# Patient Record
Sex: Female | Born: 1955 | Race: Asian | Hispanic: No | Smoking: Never smoker
Health system: Southern US, Community
[De-identification: ages and names within clinical notes are randomized; demographics above are authoritative.]

## PROBLEM LIST (undated history)

## (undated) ENCOUNTER — Emergency Department (HOSPITAL_COMMUNITY): Admission: EM | Payer: Managed Care, Other (non HMO) | Source: Home / Self Care

## (undated) DIAGNOSIS — N840 Polyp of corpus uteri: Secondary | ICD-10-CM

## (undated) DIAGNOSIS — N63 Unspecified lump in unspecified breast: Secondary | ICD-10-CM

## (undated) HISTORY — PX: BREAST ENHANCEMENT SURGERY: SHX7

## (undated) HISTORY — DX: Unspecified lump in unspecified breast: N63.0

## (undated) HISTORY — DX: Polyp of corpus uteri: N84.0

---

## 1986-02-02 HISTORY — PX: AUGMENTATION MAMMAPLASTY: SUR837

## 2003-10-12 ENCOUNTER — Other Ambulatory Visit: Admission: RE | Admit: 2003-10-12 | Discharge: 2003-10-12 | Payer: Self-pay | Admitting: Obstetrics and Gynecology

## 2003-11-05 ENCOUNTER — Encounter (INDEPENDENT_AMBULATORY_CARE_PROVIDER_SITE_OTHER): Payer: Self-pay | Admitting: Specialist

## 2003-11-05 ENCOUNTER — Ambulatory Visit (HOSPITAL_COMMUNITY): Admission: RE | Admit: 2003-11-05 | Discharge: 2003-11-05 | Payer: Self-pay | Admitting: Obstetrics and Gynecology

## 2003-11-30 ENCOUNTER — Encounter: Admission: RE | Admit: 2003-11-30 | Discharge: 2003-11-30 | Payer: Self-pay | Admitting: Obstetrics and Gynecology

## 2004-01-03 ENCOUNTER — Encounter: Admission: RE | Admit: 2004-01-03 | Discharge: 2004-01-03 | Payer: Self-pay | Admitting: Sports Medicine

## 2004-06-26 ENCOUNTER — Ambulatory Visit: Payer: Self-pay | Admitting: Internal Medicine

## 2004-10-20 ENCOUNTER — Other Ambulatory Visit: Admission: RE | Admit: 2004-10-20 | Discharge: 2004-10-20 | Payer: Self-pay | Admitting: Obstetrics and Gynecology

## 2004-10-22 ENCOUNTER — Encounter: Admission: RE | Admit: 2004-10-22 | Discharge: 2004-10-22 | Payer: Self-pay | Admitting: Sports Medicine

## 2004-12-01 ENCOUNTER — Encounter: Admission: RE | Admit: 2004-12-01 | Discharge: 2004-12-01 | Payer: Self-pay | Admitting: Obstetrics and Gynecology

## 2005-08-07 ENCOUNTER — Ambulatory Visit: Payer: Self-pay | Admitting: Internal Medicine

## 2005-08-21 ENCOUNTER — Ambulatory Visit: Payer: Self-pay | Admitting: Internal Medicine

## 2005-09-15 ENCOUNTER — Encounter: Admission: RE | Admit: 2005-09-15 | Discharge: 2005-09-15 | Payer: Self-pay | Admitting: Family Medicine

## 2005-11-03 ENCOUNTER — Other Ambulatory Visit: Admission: RE | Admit: 2005-11-03 | Discharge: 2005-11-03 | Payer: Self-pay | Admitting: Obstetrics and Gynecology

## 2005-12-02 ENCOUNTER — Encounter: Admission: RE | Admit: 2005-12-02 | Discharge: 2005-12-02 | Payer: Self-pay | Admitting: Obstetrics and Gynecology

## 2006-11-15 ENCOUNTER — Other Ambulatory Visit: Admission: RE | Admit: 2006-11-15 | Discharge: 2006-11-15 | Payer: Self-pay | Admitting: Obstetrics and Gynecology

## 2006-12-24 ENCOUNTER — Encounter: Admission: RE | Admit: 2006-12-24 | Discharge: 2006-12-24 | Payer: Self-pay | Admitting: Obstetrics and Gynecology

## 2007-11-08 ENCOUNTER — Other Ambulatory Visit: Admission: RE | Admit: 2007-11-08 | Discharge: 2007-11-08 | Payer: Self-pay | Admitting: Obstetrics and Gynecology

## 2007-11-21 ENCOUNTER — Encounter: Admission: RE | Admit: 2007-11-21 | Discharge: 2007-11-21 | Payer: Self-pay | Admitting: Rheumatology

## 2008-11-08 ENCOUNTER — Encounter: Admission: RE | Admit: 2008-11-08 | Discharge: 2008-11-08 | Payer: Self-pay | Admitting: Obstetrics and Gynecology

## 2008-11-08 ENCOUNTER — Other Ambulatory Visit: Admission: RE | Admit: 2008-11-08 | Discharge: 2008-11-08 | Payer: Self-pay | Admitting: Obstetrics and Gynecology

## 2008-11-09 ENCOUNTER — Encounter: Admission: RE | Admit: 2008-11-09 | Discharge: 2008-11-09 | Payer: Self-pay | Admitting: Obstetrics and Gynecology

## 2010-05-29 ENCOUNTER — Other Ambulatory Visit: Payer: Self-pay | Admitting: Obstetrics and Gynecology

## 2010-05-29 ENCOUNTER — Other Ambulatory Visit (HOSPITAL_COMMUNITY)
Admission: RE | Admit: 2010-05-29 | Discharge: 2010-05-29 | Disposition: A | Discharge status: Home / Self Care | Payer: Managed Care, Other (non HMO) | Source: Ambulatory Visit | Attending: Obstetrics and Gynecology | Admitting: Obstetrics and Gynecology

## 2010-05-29 DIAGNOSIS — Z01419 Encounter for gynecological examination (general) (routine) without abnormal findings: Secondary | ICD-10-CM | POA: Insufficient documentation

## 2010-06-20 NOTE — Op Note (Signed)
NAMETRISTIAN, BOUSKA                ACCOUNT NO.:  [id number]   MEDICAL RECORD NO.:  [id number]          PATIENT TYPE:  AMB   LOCATION:  SDC                           FACILITY:  [facility]   PHYSICIAN:  James A. Ashley Royalty, M.D.DATE OF BIRTH:  May 22, 1955   DATE OF PROCEDURE:  11/05/2003  DATE OF DISCHARGE:                                 OPERATIVE REPORT   PREOPERATIVE DIAGNOSES:  1.  Uterine polyp.  2.  Abnormal uterine bleeding, probably secondary to #1.   POSTOPERATIVE DIAGNOSES:  1.  Uterine polyp.  2.  Abnormal uterine bleeding, probably secondary to #1, pathology pending.   PROCEDURE:  1.  Diagnostic/operative hysteroscopy.  2.  Polypectomy.  3.  Dilatation and curettage.   SURGEON:  Rudy Jew. Ashley Royalty, M.D.   ANESTHESIA:  General.   ESTIMATED BLOOD LOSS:  25 mL.   COMPLICATIONS:  None.   PACKS AND DRAINS:  None.   DESCRIPTION OF PROCEDURE:  The patient was taken to the operating room and  placed in the dorsal supine position.  After general anesthesia was  administered, she was placed in the lithotomy position and prepped and  draped in the usual manner for vaginal surgery.  A posterior weighted  retractor was placed per vagina.  The anterior lip of the cervix was grasped  with a single-tooth tenaculum.  The uterus was then gently sounded to 8 cm  with the uterine sound.  The cervix was then dilated to a size 29 [place]  using [employer] dilators.  The cervix was very hard and attempt to dilate the  cervix resulted in a laceration of the tenaculum site which was later  repaired without difficulty using 2-0 chromic catgut.  The hysteroscope was  placed using sorbitol as a distention medium.  The uterine cavity was  thoroughly explored.  The left and right tubal ostia were easily noted.  Appropriate photos were obtained.  In the lower uterine segment on the left  posterior aspect there was a long polyp of 1.5 plus cm in greatest diameter.  The base was rather narrow resulting in a  pedunculated appearance.  Using  the resectoscope at 70 watts cutting wave form, this was easily excised and  submitted to pathology for histologic studies.  Hemostasis was obtained with  the coagulation wave form at 50 watts power.   Attention was then turned to the uterine curettage.  First a four quadrant  technique was employed with a medium size curet.  Then a therapeutic  curettage was performed.  All curettings were submitted to pathology for  histologic studies.  Hemostasis was noted.  After the cervical laceration,  hemostasis was noted and the procedure terminated.   The patient tolerated the procedure extremely well and was returned to the  recovery room in good condition.      JAM/MEDQ  D:  11/05/2003  T:  11/05/2003  Job:  [id number]

## 2011-10-26 ENCOUNTER — Other Ambulatory Visit (HOSPITAL_COMMUNITY)
Admission: RE | Admit: 2011-10-26 | Discharge: 2011-10-26 | Disposition: A | Discharge status: Home / Self Care | Payer: Managed Care, Other (non HMO) | Source: Ambulatory Visit | Attending: Obstetrics and Gynecology | Admitting: Obstetrics and Gynecology

## 2011-10-26 ENCOUNTER — Other Ambulatory Visit: Payer: Self-pay | Admitting: Obstetrics and Gynecology

## 2011-10-26 DIAGNOSIS — Z01419 Encounter for gynecological examination (general) (routine) without abnormal findings: Secondary | ICD-10-CM | POA: Insufficient documentation

## 2012-04-01 ENCOUNTER — Other Ambulatory Visit: Payer: Self-pay | Admitting: Obstetrics and Gynecology

## 2012-04-01 DIAGNOSIS — N6320 Unspecified lump in the left breast, unspecified quadrant: Secondary | ICD-10-CM

## 2012-04-13 ENCOUNTER — Other Ambulatory Visit: Payer: Managed Care, Other (non HMO)

## 2012-04-20 ENCOUNTER — Ambulatory Visit
Admission: RE | Admit: 2012-04-20 | Discharge: 2012-04-20 | Disposition: A | Discharge status: Home / Self Care | Payer: Managed Care, Other (non HMO) | Source: Ambulatory Visit | Attending: Obstetrics and Gynecology | Admitting: Obstetrics and Gynecology

## 2012-04-20 ENCOUNTER — Other Ambulatory Visit: Payer: Self-pay | Admitting: Obstetrics and Gynecology

## 2012-10-31 ENCOUNTER — Other Ambulatory Visit: Payer: Self-pay | Admitting: Family Medicine

## 2012-10-31 DIAGNOSIS — R200 Anesthesia of skin: Secondary | ICD-10-CM

## 2012-11-02 ENCOUNTER — Ambulatory Visit
Admission: RE | Admit: 2012-11-02 | Discharge: 2012-11-02 | Disposition: A | Discharge status: Home / Self Care | Payer: Managed Care, Other (non HMO) | Source: Ambulatory Visit | Attending: Family Medicine | Admitting: Family Medicine

## 2012-11-02 DIAGNOSIS — R200 Anesthesia of skin: Secondary | ICD-10-CM

## 2012-11-02 MED ORDER — GADOBENATE DIMEGLUMINE 529 MG/ML IV SOLN
10.0000 mL | Freq: Once | INTRAVENOUS | Status: AC | PRN
  Administered 2012-11-02: 10 mL via INTRAVENOUS

## 2012-11-16 ENCOUNTER — Other Ambulatory Visit (HOSPITAL_COMMUNITY)
Admission: RE | Admit: 2012-11-16 | Discharge: 2012-11-16 | Disposition: A | Discharge status: Home / Self Care | Payer: Managed Care, Other (non HMO) | Source: Ambulatory Visit | Attending: Obstetrics and Gynecology | Admitting: Obstetrics and Gynecology

## 2012-11-16 ENCOUNTER — Other Ambulatory Visit: Payer: Self-pay | Admitting: Obstetrics and Gynecology

## 2012-11-16 DIAGNOSIS — Z01419 Encounter for gynecological examination (general) (routine) without abnormal findings: Secondary | ICD-10-CM | POA: Insufficient documentation

## 2012-11-16 DIAGNOSIS — Z1151 Encounter for screening for human papillomavirus (HPV): Secondary | ICD-10-CM | POA: Insufficient documentation

## 2013-01-16 ENCOUNTER — Other Ambulatory Visit: Payer: Managed Care, Other (non HMO)

## 2013-01-18 ENCOUNTER — Ambulatory Visit: Payer: Managed Care, Other (non HMO) | Admitting: Endocrinology

## 2013-10-17 ENCOUNTER — Other Ambulatory Visit: Payer: Self-pay

## 2013-10-17 DIAGNOSIS — Z1231 Encounter for screening mammogram for malignant neoplasm of breast: Secondary | ICD-10-CM

## 2013-10-31 ENCOUNTER — Ambulatory Visit
Admission: RE | Admit: 2013-10-31 | Discharge: 2013-10-31 | Disposition: A | Discharge status: Home / Self Care | Payer: Managed Care, Other (non HMO) | Source: Ambulatory Visit

## 2013-10-31 DIAGNOSIS — Z1231 Encounter for screening mammogram for malignant neoplasm of breast: Secondary | ICD-10-CM

## 2013-11-21 ENCOUNTER — Other Ambulatory Visit: Payer: Self-pay | Admitting: Obstetrics and Gynecology

## 2013-11-21 ENCOUNTER — Other Ambulatory Visit (HOSPITAL_COMMUNITY)
Admission: RE | Admit: 2013-11-21 | Discharge: 2013-11-21 | Disposition: A | Discharge status: Home / Self Care | Payer: Managed Care, Other (non HMO) | Source: Ambulatory Visit | Attending: Obstetrics and Gynecology | Admitting: Obstetrics and Gynecology

## 2013-11-21 DIAGNOSIS — Z01411 Encounter for gynecological examination (general) (routine) with abnormal findings: Secondary | ICD-10-CM | POA: Insufficient documentation

## 2013-11-22 LAB — CYTOLOGY - PAP

## 2014-11-22 ENCOUNTER — Other Ambulatory Visit (HOSPITAL_COMMUNITY)
Admission: RE | Admit: 2014-11-22 | Discharge: 2014-11-22 | Disposition: A | Discharge status: Home / Self Care | Payer: Managed Care, Other (non HMO) | Source: Ambulatory Visit | Attending: Obstetrics and Gynecology | Admitting: Obstetrics and Gynecology

## 2014-11-22 ENCOUNTER — Other Ambulatory Visit: Payer: Self-pay | Admitting: Obstetrics and Gynecology

## 2014-11-22 DIAGNOSIS — Z01419 Encounter for gynecological examination (general) (routine) without abnormal findings: Secondary | ICD-10-CM | POA: Insufficient documentation

## 2014-11-26 LAB — CYTOLOGY - PAP

## 2015-01-02 ENCOUNTER — Other Ambulatory Visit: Payer: Self-pay

## 2015-01-02 DIAGNOSIS — Z1231 Encounter for screening mammogram for malignant neoplasm of breast: Secondary | ICD-10-CM

## 2015-02-07 ENCOUNTER — Ambulatory Visit: Payer: Managed Care, Other (non HMO)

## 2015-02-07 DIAGNOSIS — N63 Unspecified lump in unspecified breast: Secondary | ICD-10-CM | POA: Insufficient documentation

## 2015-07-17 ENCOUNTER — Encounter: Payer: Self-pay | Admitting: Gastroenterology

## 2015-12-24 ENCOUNTER — Encounter: Payer: Self-pay | Admitting: Cardiovascular Disease

## 2015-12-24 ENCOUNTER — Ambulatory Visit (INDEPENDENT_AMBULATORY_CARE_PROVIDER_SITE_OTHER): Payer: Self-pay | Admitting: Cardiovascular Disease

## 2015-12-24 VITALS — BP 122/82 | HR 81 | Ht 62.0 in | Wt 118.0 lb

## 2015-12-24 DIAGNOSIS — R5383 Other fatigue: Secondary | ICD-10-CM

## 2015-12-24 DIAGNOSIS — R011 Cardiac murmur, unspecified: Secondary | ICD-10-CM

## 2015-12-24 DIAGNOSIS — R42 Dizziness and giddiness: Secondary | ICD-10-CM

## 2015-12-24 DIAGNOSIS — Z13220 Encounter for screening for lipoid disorders: Secondary | ICD-10-CM

## 2015-12-24 LAB — TSH: TSH: 2.52 mIU/L

## 2015-12-24 LAB — COMPREHENSIVE METABOLIC PANEL
ALBUMIN: 4.6 g/dL (ref 3.6–5.1)
ALT: 31 U/L — ABNORMAL HIGH (ref 6–29)
AST: 34 U/L (ref 10–35)
Alkaline Phosphatase: 55 U/L (ref 33–130)
BUN: 11 mg/dL (ref 7–25)
CHLORIDE: 103 mmol/L (ref 98–110)
CO2: 25 mmol/L (ref 20–31)
Calcium: 9.8 mg/dL (ref 8.6–10.4)
Creat: 0.66 mg/dL (ref 0.50–0.99)
Glucose, Bld: 93 mg/dL (ref 65–99)
POTASSIUM: 4.7 mmol/L (ref 3.5–5.3)
Sodium: 139 mmol/L (ref 135–146)
TOTAL PROTEIN: 6.7 g/dL (ref 6.1–8.1)
Total Bilirubin: 0.6 mg/dL (ref 0.2–1.2)

## 2015-12-24 LAB — CBC
HCT: 40.6 % (ref 35.0–45.0)
HEMOGLOBIN: 13.7 g/dL (ref 11.7–15.5)
MCH: 31.5 pg (ref 27.0–33.0)
MCHC: 33.7 g/dL (ref 32.0–36.0)
MCV: 93.3 fL (ref 80.0–100.0)
MPV: 9.8 fL (ref 7.5–12.5)
Platelets: 232 10*3/uL (ref 140–400)
RBC: 4.35 MIL/uL (ref 3.80–5.10)
RDW: 13 % (ref 11.0–15.0)
WBC: 3.4 10*3/uL — ABNORMAL LOW (ref 3.8–10.8)

## 2015-12-24 LAB — LIPID PANEL
CHOL/HDL RATIO: 2.7 ratio (ref <5.0)
CHOLESTEROL: 196 mg/dL (ref <200)
HDL: 73 mg/dL (ref >50)
LDL Cholesterol: 107 mg/dL — ABNORMAL HIGH (ref <100)
Triglycerides: 79 mg/dL (ref <150)
VLDL: 16 mg/dL (ref <30)

## 2015-12-24 NOTE — Patient Instructions (Addendum)
Your physician has requested that you have an echocardiogram. Echocardiography is a painless test that uses sound waves to create images of your heart. It provides your doctor with information about the size and shape of your heart and how well your heart's chambers and valves are working. This procedure takes approximately one hour. There are no restrictions for this procedure.   Your physician recommends that you return for lab work FASTING.  Your physician recommends that you schedule a follow-up appointment in: AS NEEDED.

## 2015-12-29 NOTE — Progress Notes (Signed)
Cardiology Office Note    Date:  12/29/2015   ID:  Kayla Gill, DOB December 25, 1955, MRN [id number]  PCP:  Antony Blackbird, MD  Cardiologist:  Shelva Majestic, MD   Transient episodes of dizziness and lightheadedness and establishment of cardiology care.  History of Present Illness:  Kayla Gill is a 60 y.o. female who is originally from [place].  She is self-referred.  He has recently experienced short-lived episodes of dizziness in the early morning.  At times she has sensed a feeling of  Fatigue. She exercises regularly and does body pump, rowing, and treadmill exercises without difficulty..  She denies any exertionally precipitated chest pain.  There is no history of diabetes mellitus.  She is able to do body pump and get her heart rate up and finds that are weight returns to normal fairly quickly.  She is unaware of any arrhythmia.  She sleeps well.  She denies any snoring or symptoms suggestive of sleep apnea.  After exercise.  She actually feels well.  Heart rate typically increases to 160-170 bpm at peak exercise and he does not notice chest pressure at this increased heart rate.  In June 2017.  She underwent a life screening evaluation.  Laboratory revealed a hemoglobin A1c of 5.7.  Total cholesterol was 197, HDL 82, LDL 93, and triglycerides 111.  She had normal renal function.  There is no history of abdominal aortic aneurysm or PVD.  She presents for evaluation.   Past Medical History:  Diagnosis Date  . Breast mass in female   . DM (diabetes mellitus), type 2 ([place])     Past Surgical History:  Procedure Laterality Date  . [place]   never replaced, has had injected silicone  . BREAST ENHANCEMENT SURGERY    . CESAREAN SECTION      Current Medications: No outpatient prescriptions prior to visit.   No facility-administered medications prior to visit.      Allergies:   Patient has no allergy information on record.   Social History   Social  History  . Marital status: Married    Spouse name: N/A  . Number of children: 2  . Years of education: N/A   Social History Main Topics  . Smoking status: Never Smoker  . Smokeless tobacco: Never Used  . Alcohol use Yes     Comment: Rare  . Drug use: No  . Sexual activity: Not Asked   Other Topics Concern  . None   Social History Narrative  . None    Social social history is notable in that she is married and has 2 children, ages 35 and 66.  She was born in [place].  She went to undergraduate college in [place].  Upon coming to the [place].  She has lived in [place], [place], and most recently, [place].  Family History:  The patient's family history includes Dementia in her mother; Diabetes in her father; Healthy in her daughter and son; Hypertension in her father.  Her mother died at age 83.  Her father died at age 54.  She has 4 brothers who are living, as well as a sister.  ROS General: Negative; No fevers, chills, or night sweats;  HEENT: Negative; No changes in vision or hearing, sinus congestion, difficulty swallowing Pulmonary: Negative; No cough, wheezing, shortness of breath, hemoptysis Cardiovascular: Negative; No chest pain, presyncope, syncope, palpitations GI: Negative; No nausea, vomiting, diarrhea, or abdominal pain GU: Negative; No dysuria, hematuria, or  difficulty voiding Musculoskeletal: Negative; no myalgias, joint pain, or weakness Hematologic/Oncology: Negative; no easy bruising, bleeding Endocrine: Negative; no heat/cold intolerance; no diabetes Neuro: Negative; no changes in balance, headaches Skin: Negative; No rashes or skin lesions Psychiatric: Negative; No behavioral problems, depression Sleep: Negative; No snoring, daytime sleepiness, hypersomnolence, bruxism, restless legs, hypnogognic hallucinations, no cataplexy Other comprehensive 14 point system review is negative.   PHYSICAL EXAM:   VS:  BP 122/82   Pulse 81   Ht '5\' 2"'   (1.575 m)   Wt 118 lb (53.5 kg)   BMI 21.58 kg/m    Wt Readings from Last 3 Encounters:  12/24/15 118 lb (53.5 kg)    General: Alert, oriented, no distress. She appears much younger than her stated age. Skin: normal turgor, no rashes, warm and dry HEENT: Normocephalic, atraumatic. Pupils equal round and reactive to light; sclera anicteric; extraocular muscles intact; Fundi normal Nose without nasal septal hypertrophy Mouth/Parynx benign; Mallinpatti scale 2 Neck: No JVD, no carotid bruits; normal carotid upstroke Lungs: clear to ausculatation and percussion; no wheezing or rales Chest wall: without tenderness to palpitation.  She has bilateral silicon breast implants. Heart: PMI not displaced, RRR, s1 s2 normal, 6-5/5 systolic murmur at the apex, no diastolic murmur, no rubs, gallops, thrills, or heaves Abdomen: soft, nontender; no hepatosplenomehaly, BS+; abdominal aorta nontender and not dilated by palpation. Back: no CVA tenderness Pulses 2+ Musculoskeletal: full range of motion, normal strength, no joint deformities Extremities: no clubbing cyanosis or edema, Homan's sign negative  Neurologic: grossly nonfocal; Cranial nerves grossly wnl Psychologic: Normal mood and affect   Studies/Labs Reviewed:   ECG (independently read by me): Normal sinus rhythm with mild sinus arrhythmia, heart rate 81 bpm.  QTc interval 455 ms.  No ST segment changes.  Recent Labs: BMP Latest Ref Rng & Units 12/24/2015  Glucose 65 - 99 mg/dL 93  BUN 7 - 25 mg/dL 11  Creatinine 0.50 - 0.99 mg/dL 0.66  Sodium 135 - 146 mmol/L 139  Potassium 3.5 - 5.3 mmol/L 4.7  Chloride 98 - 110 mmol/L 103  CO2 20 - 31 mmol/L 25  Calcium 8.6 - 10.4 mg/dL 9.8     Hepatic Function Latest Ref Rng & Units 12/24/2015  Total Protein 6.1 - 8.1 g/dL 6.7  Albumin 3.6 - 5.1 g/dL 4.6  AST 10 - 35 U/L 34  ALT 6 - 29 U/L 31(H)  Alk Phosphatase 33 - 130 U/L 55  Total Bilirubin 0.2 - 1.2 mg/dL 0.6    CBC Latest Ref Rng &  Units 12/24/2015  WBC 3.8 - 10.8 K/uL 3.4(L)  Hemoglobin 11.7 - 15.5 g/dL 13.7  Hematocrit 35.0 - 45.0 % 40.6  Platelets 140 - 400 K/uL 232   Lab Results  Component Value Date   MCV 93.3 12/24/2015   Lab Results  Component Value Date   TSH 2.52 12/24/2015   No results found for: HGBA1C   BNP No results found for: BNP  ProBNP No results found for: PROBNP   Lipid Panel     Component Value Date/Time   CHOL 196 12/24/2015 1225   TRIG 79 12/24/2015 1225   HDL 73 12/24/2015 1225   CHOLHDL 2.7 12/24/2015 1225   VLDL 16 12/24/2015 1225   LDLCALC 107 (H) 12/24/2015 1225     RADIOLOGY: No results found.   Additional studies/ records that were reviewed today include:  I reviewed her recent diagnostic mammogram, which did not reveal any suspicious mass or calcifications in either breasts and  showed bilateral retro-pectoral silicone implants with free silicone within both breasts.  I also reviewed her life screen evaluation with regards to blood work as well as abdominal aortic ultrasound, carotid studies, and PV screening evaluation    ASSESSMENT:    1. Dizziness   2. Murmur   3. Screening, lipid   4. Fatigue, unspecified type      PLAN:  Ms. Kayla Gill is a very pleasant 60 year old [place] female who presents with a chief complaint of occasional fatigue and episodes of transient lightheadedness.  She exercises regularly at least 5-6 days per week and typically does high-intensity workouts with body pump, rowing, and treadmill exercising.  There is no history of exertional chest pain.  Typically with exercise her HR may increased to 160-170 bpm. .  She denies any lightheadedness at this time.  At times she has noted some mild lightheadedness in the early morning prior to breakfast.  There is no history of diabetes mellitus or previous documentation of hypoglycemia.  I am checking laboratory in the fasting state.  I reviewed her life screening evaluation including  her blood work, as well as screening for abdominal aortic aneurysm, PVD, and carotid studies which were all normal.  She does have a 1 to 2/6 systolic murmur at the apex.  I will schedule her for cardiac echo Doppler study to evaluate her systolic and diastolic function and cardiac murmur.  She appears to be aerobically conditioned and admits to a slow increase in her heart rate with exercise and a rapid resolution following exercise, suggestive of good aerobic capacity.  I will contact her regarding her laboratory and echo Doppler study.  I reassured her that I felt she was stable from a cardiac standpoint.  She denies any awareness of resting tachycardia palpitations or sensation of arrhythmia at this point, I do not feel that cardiac monitoring is indicated.  I will see see her back in follow-up evaluation .  If her above studies are abnormal or otherwise be available as needed if future problems arise.   Medication Adjustments/Labs and Tests Ordered: Current medicines are reviewed at length with the patient today.  Concerns regarding medicines are outlined above.  Medication changes, Labs and Tests ordered today are listed in the Patient Instructions below.  Patient Instructions  Your physician has requested that you have an echocardiogram. Echocardiography is a painless test that uses sound waves to create images of your heart. It provides your doctor with information about the size and shape of your heart and how well your heart's chambers and valves are working. This procedure takes approximately one hour. There are no restrictions for this procedure.   Your physician recommends that you return for lab work FASTING.  Your physician recommends that you schedule a follow-up appointment in: AS NEEDED.         Signed, Shelva Majestic, MD  12/29/2015 9:28 PM    [facility] [address], [place], [place], [place]  [address] Phone: [phone]

## 2015-12-30 ENCOUNTER — Encounter: Payer: Self-pay | Admitting: Cardiovascular Disease

## 2016-01-20 ENCOUNTER — Other Ambulatory Visit: Payer: Self-pay

## 2016-01-20 ENCOUNTER — Ambulatory Visit (HOSPITAL_COMMUNITY): Payer: Self-pay | Attending: Cardiovascular Disease

## 2016-01-20 DIAGNOSIS — R011 Cardiac murmur, unspecified: Secondary | ICD-10-CM | POA: Insufficient documentation

## 2016-01-20 DIAGNOSIS — R42 Dizziness and giddiness: Secondary | ICD-10-CM | POA: Insufficient documentation

## 2016-01-20 LAB — ECHOCARDIOGRAM COMPLETE
CHL CUP TV REG PEAK VELOCITY: 247 cm/s
E/e' ratio: 9.72
EWDT: 173 ms
FS: 44 % (ref 28–44)
IVS/LV PW RATIO, ED: 0.71
LA ID, A-P, ES: 28 mm
LA diam end sys: 28 mm
LADIAMINDEX: 1.83 cm/m2
LAVOL: 27.5 mL
LAVOLA4C: 26.7 mL
LAVOLIN: 18 mL/m2
LV E/e' medial: 9.72
LV e' LATERAL: 10.8 cm/s
LVEEAVG: 9.72
LVOT SV: 55 mL
LVOT VTI: 21.8 cm
LVOT area: 2.54 cm2
LVOT diameter: 18 mm
LVOT peak vel: 109 cm/s
Lateral S' vel: 13.2 cm/s
MV Dec: 173
MV Peak grad: 4 mmHg
MV pk E vel: 105 m/s
MVPKAVEL: 78.6 m/s
PW: 9.2 mm — AB (ref 0.6–1.1)
TAPSE: 24.6 mm
TDI e' lateral: 10.8
TDI e' medial: 9.36
TRMAXVEL: 247 cm/s

## 2016-01-21 ENCOUNTER — Encounter: Payer: Self-pay | Admitting: Cardiovascular Disease

## 2016-02-25 ENCOUNTER — Encounter: Payer: Self-pay | Admitting: Gastroenterology

## 2016-03-17 ENCOUNTER — Ambulatory Visit: Payer: Self-pay | Admitting: *Deleted

## 2016-03-17 VITALS — Ht 61.0 in | Wt 116.0 lb

## 2016-03-17 DIAGNOSIS — Z1211 Encounter for screening for malignant neoplasm of colon: Secondary | ICD-10-CM

## 2016-03-17 MED ORDER — NA SULFATE-K SULFATE-MG SULF 17.5-3.13-1.6 GM/177ML PO SOLN: 1.0000 | Freq: Once | ORAL | 0 refills | Status: AC

## 2016-03-17 NOTE — Progress Notes (Signed)
Denies allergies to eggs or soy products. Denies complications with sedation or anesthesia. Denies O2 use. Denies use of diet or weight loss medications.  Emmi instructions given for colonoscopy.  

## 2016-03-31 ENCOUNTER — Encounter: Payer: Self-pay | Admitting: Gastroenterology

## 2016-03-31 ENCOUNTER — Ambulatory Visit (AMBULATORY_SURGERY_CENTER): Payer: Self-pay | Admitting: Gastroenterology

## 2016-03-31 VITALS — BP 133/82 | HR 73 | Temp 96.2°F | Resp 16 | Ht 61.0 in | Wt 116.0 lb

## 2016-03-31 DIAGNOSIS — Z1212 Encounter for screening for malignant neoplasm of rectum: Secondary | ICD-10-CM

## 2016-03-31 DIAGNOSIS — D122 Benign neoplasm of ascending colon: Secondary | ICD-10-CM

## 2016-03-31 DIAGNOSIS — Z1211 Encounter for screening for malignant neoplasm of colon: Secondary | ICD-10-CM

## 2016-03-31 MED ORDER — SODIUM CHLORIDE 0.9 % IV SOLN: 500.0000 mL | INTRAVENOUS | Status: DC

## 2016-03-31 NOTE — Progress Notes (Signed)
Report to PACU, RN, vss, BBS= Clear.  

## 2016-03-31 NOTE — Op Note (Signed)
[facility] Patient Name: Kayla Gill Procedure Date: 03/31/2016 7:42 AM MRN: [id number] Endoscopist: Mauri Pole , MD Age: 61 Referring MD:  Date of Birth: Dec 27, 1955 Gender: Female Account #: [id number] Procedure:                Colonoscopy Indications:              Screening for colorectal malignant neoplasm, Last                            colonoscopy: 2007 Medicines:                Monitored Anesthesia Care Procedure:                Pre-Anesthesia Assessment:                           - Prior to the procedure, a History and Physical                            was performed, and patient medications and                            allergies were reviewed. The patient's tolerance of                            previous anesthesia was also reviewed. The risks                            and benefits of the procedure and the sedation                            options and risks were discussed with the patient.                            All questions were answered, and informed consent                            was obtained. Prior Anticoagulants: The patient has                            taken no previous anticoagulant or antiplatelet                            agents. ASA Grade Assessment: II - A patient with                            mild systemic disease. After reviewing the risks                            and benefits, the patient was deemed in                            satisfactory condition to undergo the procedure.  After obtaining informed consent, the colonoscope                            was passed under direct vision. Throughout the                            procedure, the patient's blood pressure, pulse, and                            oxygen saturations were monitored continuously. The                            Colonoscope was introduced through the anus and                            advanced to the the terminal ileum, with                             identification of the appendiceal orifice and IC                            valve. The colonoscopy was performed without                            difficulty. The patient tolerated the procedure                            well. The quality of the bowel preparation was                            excellent. The terminal ileum, ileocecal valve,                            appendiceal orifice, and rectum were photographed. Scope In: 8:13:03 AM Scope Out: 8:26:34 AM Scope Withdrawal Time: 0 hours 8 minutes 57 seconds  Total Procedure Duration: 0 hours 13 minutes 31 seconds  Findings:                 The perianal and digital rectal examinations were                            normal.                           A 2 mm polyp was found in the ascending colon. The                            polyp was sessile. The polyp was removed with a                            cold biopsy forceps. Resection and retrieval were                            complete.  The exam was otherwise without abnormality. Complications:            No immediate complications. Estimated Blood Loss:     Estimated blood loss was minimal. Impression:               - One 2 mm polyp in the ascending colon, removed                            with a cold biopsy forceps. Resected and retrieved.                           - The examination was otherwise normal. Recommendation:           - Patient has a contact number available for                            emergencies. The signs and symptoms of potential                            delayed complications were discussed with the                            patient. Return to normal activities tomorrow.                            Written discharge instructions were provided to the                            patient.                           - Resume previous diet.                           - Continue present medications.                            - Await pathology results.                           - Repeat colonoscopy in 5-10 years for surveillance                            based on pathology results. Mauri Pole, MD 03/31/2016 8:33:24 AM This report has been signed electronically.

## 2016-03-31 NOTE — Progress Notes (Signed)
Called to room to assist during endoscopic procedure.  Patient ID and intended procedure confirmed with present staff. Received instructions for my participation in the procedure from the performing physician.  

## 2016-03-31 NOTE — Patient Instructions (Signed)
YOU HAD AN ENDOSCOPIC PROCEDURE TODAY AT THE [facility]:   Refer to the procedure report that was given to you for any specific questions about what was found during the examination.  If the procedure report does not answer your questions, please call your gastroenterologist to clarify.  If you requested that your care partner not be given the details of your procedure findings, then the procedure report has been included in a sealed envelope for you to review at your convenience later.  YOU SHOULD EXPECT: Some feelings of bloating in the abdomen. Passage of more gas than usual.  Walking can help get rid of the air that was put into your GI tract during the procedure and reduce the bloating. If you had a lower endoscopy (such as a colonoscopy or flexible sigmoidoscopy) you may notice spotting of blood in your stool or on the toilet paper. If you underwent a bowel prep for your procedure, you may not have a normal bowel movement for a few days.  Please Note:  You might notice some irritation and congestion in your nose or some drainage.  This is from the oxygen used during your procedure.  There is no need for concern and it should clear up in a day or so.  SYMPTOMS TO REPORT IMMEDIATELY:   Following lower endoscopy (colonoscopy or flexible sigmoidoscopy):  Excessive amounts of blood in the stool  Significant tenderness or worsening of abdominal pains  Swelling of the abdomen that is new, acute  Fever of 100F or higher   For urgent or emergent issues, a gastroenterologist can be reached at any hour by calling [phone].   DIET:  We do recommend a small meal at first, but then you may proceed to your regular diet.  Drink plenty of fluids but you should avoid alcoholic beverages for 24 hours.  ACTIVITY:  You should plan to take it easy for the rest of today and you should NOT DRIVE or use heavy machinery until tomorrow (because of the sedation medicines used during the test).     FOLLOW UP: Our staff will call the number listed on your records the next business day following your procedure to check on you and address any questions or concerns that you may have regarding the information given to you following your procedure. If we do not reach you, we will leave a message.  However, if you are feeling well and you are not experiencing any problems, there is no need to return our call.  We will assume that you have returned to your regular daily activities without incident.  If any biopsies were taken you will be contacted by phone or by letter within the next 1-3 weeks.  Please call us at [phone] if you have not heard about the biopsies in 3 weeks.    SIGNATURES/CONFIDENTIALITY: You and/or your care partner have signed paperwork which will be entered into your electronic medical record.  These signatures attest to the fact that that the information above on your After Visit Summary has been reviewed and is understood.  Full responsibility of the confidentiality of this discharge information lies with you and/or your care-partner.  Read all of the handouts given to you by your recovery room nurse. Thank-you for choosing us for your healthcare needs today. 

## 2016-03-31 NOTE — Progress Notes (Signed)
Pt's states no medical or surgical changes since previsit or office visit. 

## 2016-04-01 ENCOUNTER — Telehealth: Payer: Self-pay

## 2016-04-01 ENCOUNTER — Telehealth: Payer: Self-pay | Admitting: *Deleted

## 2016-04-01 NOTE — Telephone Encounter (Signed)
NO answer left message will attempt to call back later this afternoon. Sm

## 2016-04-01 NOTE — Telephone Encounter (Signed)
Attempted 2nd follow-up call post colonoscopy. No answer. Left message to call office if she is having any problems.

## 2016-04-13 ENCOUNTER — Encounter: Payer: Self-pay | Admitting: Gastroenterology

## 2016-05-08 ENCOUNTER — Telehealth: Payer: Self-pay | Admitting: *Deleted

## 2016-05-08 NOTE — Telephone Encounter (Signed)
PreVisit Call attempted. Left VM. 

## 2016-05-11 ENCOUNTER — Ambulatory Visit (INDEPENDENT_AMBULATORY_CARE_PROVIDER_SITE_OTHER): Payer: Self-pay | Admitting: Family Medicine

## 2016-05-11 ENCOUNTER — Other Ambulatory Visit: Payer: Self-pay | Admitting: Family Medicine

## 2016-05-11 ENCOUNTER — Encounter: Payer: Self-pay | Admitting: Family Medicine

## 2016-05-11 VITALS — BP 132/84 | HR 78 | Temp 98.0°F | Ht 62.0 in | Wt 113.8 lb

## 2016-05-11 DIAGNOSIS — M25542 Pain in joints of left hand: Secondary | ICD-10-CM

## 2016-05-11 DIAGNOSIS — Z114 Encounter for screening for human immunodeficiency virus [HIV]: Secondary | ICD-10-CM

## 2016-05-11 DIAGNOSIS — Z1159 Encounter for screening for other viral diseases: Secondary | ICD-10-CM

## 2016-05-11 DIAGNOSIS — M25541 Pain in joints of right hand: Secondary | ICD-10-CM

## 2016-05-11 DIAGNOSIS — Z9189 Other specified personal risk factors, not elsewhere classified: Secondary | ICD-10-CM

## 2016-05-11 NOTE — Progress Notes (Signed)
Pre visit review using our clinic review tool, if applicable. No additional management support is needed unless otherwise documented below in the visit note. 

## 2016-05-11 NOTE — Progress Notes (Signed)
Kayla Gill is a 61 y.o. female is here to [place].   History of Present Illness:  Kayla Gill CMA acting as scribe for Dr. Juleen China.  CC: Patient is coming in today to establish care. She has been taking a yoga class and is now having wrist pian that has been going on for 2 weeks.   HPI:  1. Arthralgia of both hands. Ongoing. PIP of digits 3-5. Takes Naproxen and Tumeric daily. No FamHx of RA.    Health Maintenance Due  Topic Date Due  . Hepatitis C Screening  06/19/1955  . HIV Screening  02/15/1970  . MAMMOGRAM  11/01/2015   PMHx, SurgHx, SocialHx, Medications, and Allergies were reviewed in the Visit Navigator and updated as appropriate.   Past Medical History:  Diagnosis Date  . Breast mass in female   . Endometrial polyp    Past Surgical History:  Procedure Laterality Date  . AUGMENTATION MAMMAPLASTY  1988  . BREAST ENHANCEMENT SURGERY    . CESAREAN SECTION     Family History  Problem Relation Age of Onset  . Diabetes Father   . Hypertension Father   . Dementia Mother   . Healthy Son   . Healthy Daughter   . Breast cancer Neg Hx   . Endometrial cancer Neg Hx   . Ovarian cancer Neg Hx    Social History  Substance Use Topics  . Smoking status: Never Smoker  . Smokeless tobacco: Never Used  . Alcohol use Yes     Comment: Rare   Current Medications and Allergies:   .  naproxen sodium (ANAPROX) 220 MG tablet, Take 220 mg by mouth daily as needed., Disp: , Rfl:   No Known Allergies   Review of Systems:   Review of Systems  Constitutional: Negative for chills, fever and malaise/fatigue.  HENT: Negative for congestion, sinus pain and sore throat.   Eyes: Negative for blurred vision and double vision.  Respiratory: Negative for cough, shortness of breath and wheezing.   Cardiovascular: Negative for chest pain, palpitations and leg swelling.  Gastrointestinal: Negative for abdominal pain, constipation and diarrhea.  Genitourinary: Negative for  dysuria.  Musculoskeletal: Positive for joint pain. Negative for neck pain.  Skin: Negative for rash.  Neurological: Negative for dizziness and headaches.  Psychiatric/Behavioral: Negative for depression, hallucinations and memory loss.   Vitals:   Vitals:   05/11/16 1342  BP: 132/84  Pulse: 78  Temp: 98 F (36.7 C)  TempSrc: Oral  SpO2: 99%  Weight: 113 lb 12.8 oz (51.6 kg)  Height: 5\' 2"  (1.575 m)     Body mass index is 20.81 kg/m.   Physical Exam:   Physical Exam  Constitutional: She appears well-nourished.  HENT:  Head: Normocephalic and atraumatic.  Eyes: EOM are normal. Pupils are equal, round, and reactive to light.  Neck: Normal range of motion. Neck supple.  Cardiovascular: Normal rate, regular rhythm, normal heart sounds and intact distal pulses.   Pulmonary/Chest: Effort normal.  Abdominal: Soft.  Musculoskeletal:       Hands: Skin: Skin is warm.  Psychiatric: She has a normal mood and affect. Her behavior is normal.  Nursing note and vitals reviewed.    Assessment and Plan:    Kayla Gill was seen today for establish care and wrist pain.  Diagnoses and all orders for this visit:  Arthralgia of both hands Comments: Continue current regimen. Okay to add Arnica gel.   Encounter for hepatitis C virus screening test for high  risk patient -     Hepatitis C antibody, reflex  Encounter for screening for HIV -     HIV antibody   . Reviewed expectations re: course of current medical issues. . Discussed self-management of symptoms. . Outlined signs and symptoms indicating need for more acute intervention. . Patient verbalized understanding and all questions were answered. . See orders for this visit as documented in the electronic medical record. . Patient received an After Visit Summary.  Records requested if needed. I spent 30 minutes with this patient, greater than 50% was face-to-face time counseling regarding the above diagnoses.  CMA served as  Education administrator during this visit. History, Physical, and Plan performed by medical provider. Documentation and orders reviewed and attested to. Briscoe Deutscher, D.O.  Briscoe Deutscher, [place], [place] 05/11/2016   Follow-up: No Follow-up on file.  No orders of the defined types were placed in this encounter.  There are no discontinued medications. Orders Placed This Encounter  Procedures  . Hepatitis C antibody, reflex  . HIV antibody

## 2016-05-12 LAB — HEPATITIS C ANTIBODY: HCV Ab: NEGATIVE

## 2016-05-12 LAB — HIV ANTIBODY (ROUTINE TESTING W REFLEX): HIV 1&2 Ab, 4th Generation: NONREACTIVE

## 2016-05-19 ENCOUNTER — Telehealth: Payer: Self-pay | Admitting: Family Medicine

## 2016-05-19 NOTE — Telephone Encounter (Signed)
Received a call from the patient. She states that she was not getting the self pay disc for the labs we sent to [facility] 05/11/2016.   Inv # [id number] Called [phone] to ask them to allow self pay discount.   I am waiting to hear back from our account manager, Carley Hammed. Will call patient to advise.

## 2016-07-14 LAB — HEPATIC FUNCTION PANEL
ALK PHOS: 59 (ref 25–125)
ALT: 21 (ref 7–35)
AST: 30 (ref 13–35)
BILIRUBIN, TOTAL: 0.4

## 2016-07-14 LAB — CBC AND DIFFERENTIAL
HCT: 41 (ref 36–46)
Hemoglobin: 14.5 (ref 12.0–16.0)
NEUTROS ABS: 2
Platelets: 255 (ref 150–399)
WBC: 3.3

## 2016-07-14 LAB — LIPID PANEL
Cholesterol: 221 — AB (ref 0–200)
HDL: 87 — AB (ref 35–70)
LDL Cholesterol: 122
Triglycerides: 58 (ref 40–160)

## 2016-07-14 LAB — TSH: TSH: 3.87 (ref 0.41–5.90)

## 2016-07-14 LAB — BASIC METABOLIC PANEL
BUN: 14 (ref 4–21)
Creatinine: 0.8 (ref 0.5–1.1)
GLUCOSE: 101
Potassium: 4.5 (ref 3.4–5.3)
Sodium: 142 (ref 137–147)

## 2016-07-14 LAB — HEMOGLOBIN A1C: Hemoglobin A1C: 5

## 2016-07-14 LAB — VITAMIN D 25 HYDROXY (VIT D DEFICIENCY, FRACTURES): Vit D, 25-Hydroxy: 82.1

## 2016-09-03 ENCOUNTER — Telehealth: Payer: Self-pay | Admitting: Family Medicine

## 2016-09-03 NOTE — Telephone Encounter (Signed)
Patient called to "discuss her billing information as she once did before" with [facility]. I advised the patient that Rachel Bo was not here today and that I could provide her with the billing department information for her to call however, she declined. Patient awaiting a call from [place].

## 2017-01-25 ENCOUNTER — Other Ambulatory Visit: Payer: Self-pay | Admitting: Obstetrics and Gynecology

## 2017-01-25 DIAGNOSIS — Z1231 Encounter for screening mammogram for malignant neoplasm of breast: Secondary | ICD-10-CM

## 2017-02-05 ENCOUNTER — Telehealth: Payer: Self-pay | Admitting: Family Medicine

## 2017-02-05 NOTE — Telephone Encounter (Signed)
Pt has been schedule with Dr. Birdie Riddle.

## 2017-02-05 NOTE — Telephone Encounter (Signed)
Patient would like to transfer care from Dr. Juleen China to Dr. Birdie Riddle. Please respond with an approval or denial of transfer. Once approval or denial from providers is completed, [place] office will need to contact patient to advise.   Copied from [place] [phone]. Topic: Appointment Scheduling - Scheduling Inquiry for Clinic >> Feb 05, 2017  8:48 AM Gill, Kayla Emperor wrote: Reason for CRM: Pt is a est pt at the [place] location with Juleen China. She would like to know if she can est care with Kayla Gill.  Please f/u with pt.

## 2017-02-05 NOTE — Telephone Encounter (Signed)
Please see note below about patient transfer.

## 2017-02-05 NOTE — Telephone Encounter (Signed)
Ok to transfer. 

## 2017-02-05 NOTE — Telephone Encounter (Signed)
Okay to transfer  

## 2017-02-18 ENCOUNTER — Encounter: Payer: Self-pay | Admitting: Family Medicine

## 2017-02-18 ENCOUNTER — Other Ambulatory Visit: Payer: Self-pay

## 2017-02-18 ENCOUNTER — Ambulatory Visit: Payer: Self-pay | Admitting: Family Medicine

## 2017-02-18 DIAGNOSIS — F4322 Adjustment disorder with anxiety: Secondary | ICD-10-CM

## 2017-02-18 MED ORDER — TRAZODONE HCL 50 MG PO TABS: 25.0000 mg | ORAL_TABLET | Freq: Every evening | ORAL | 3 refills | Status: DC | PRN

## 2017-02-18 NOTE — Assessment & Plan Note (Signed)
New.  Pt's anxiety is completely normal given the dramatic change in her situation.  She feels the OTC 5HTP is helping her during the day but she's not able to sleep at night.  Will start Trazodone and monitor closely.  Pt expressed understanding and is in agreement w/ plan.

## 2017-02-18 NOTE — Progress Notes (Signed)
   Subjective:    Patient ID: Kayla Gill, female    DOB: Jul 01, 1955, 62 y.o.   MRN: [id number]  HPI New pt.  Anxiety- pt is going through a separation that has been very difficult.  Things came to a head in August.  Pt has hx of panic attacks- continues to have anxiety.  Having difficulty sleeping b/c she is unable to turn off her brain.  She reports she feels better than when she was with her husband.  Pt was taking OTC 5HTP but this is not helping w/ sleep but is helping w/ the anxiety.  Pt feels safe in new living situation.  No thoughts of self harm.  Health Maintenance- has mammo scheduled for tomorrow.  UTD on pap smear.   Review of Systems For ROS see HPI     Objective:   Physical Exam  Constitutional: She is oriented to person, place, and time. She appears well-developed and well-nourished. No distress.  HENT:  Head: Normocephalic and atraumatic.  Eyes: Conjunctivae and EOM are normal. Pupils are equal, round, and reactive to light.  Neck: Normal range of motion. Neck supple. No thyromegaly present.  Cardiovascular: Normal rate, regular rhythm, normal heart sounds and intact distal pulses.  No murmur heard. Pulmonary/Chest: Effort normal and breath sounds normal. No respiratory distress.  Abdominal: Soft. She exhibits no distension. There is no tenderness.  Musculoskeletal: She exhibits no edema.  Lymphadenopathy:    She has no cervical adenopathy.  Neurological: She is alert and oriented to person, place, and time.  Skin: Skin is warm and dry.  Psychiatric: She has a normal mood and affect. Her behavior is normal.  Vitals reviewed.         Assessment & Plan:

## 2017-02-18 NOTE — Patient Instructions (Signed)
Follow up in 3-4 weeks to recheck sleep and anxiety Start the Trazodone nightly- start w/ 1/2 tab nightly and then increase to 1 tab if needed Keep up the good work!  You look great! Please have the mammogram place send me the report Call with any questions or concerns Welcome!  We're glad to have you!

## 2017-02-19 ENCOUNTER — Ambulatory Visit
Admission: RE | Admit: 2017-02-19 | Discharge: 2017-02-19 | Disposition: A | Discharge status: Home / Self Care | Payer: No Typology Code available for payment source | Source: Ambulatory Visit | Attending: Obstetrics and Gynecology | Admitting: Obstetrics and Gynecology

## 2017-02-19 ENCOUNTER — Other Ambulatory Visit: Payer: Self-pay | Admitting: Family Medicine

## 2017-02-19 DIAGNOSIS — Z1231 Encounter for screening mammogram for malignant neoplasm of breast: Secondary | ICD-10-CM

## 2017-02-23 ENCOUNTER — Encounter: Payer: Self-pay | Admitting: General Practice

## 2017-03-16 ENCOUNTER — Ambulatory Visit: Payer: Self-pay | Admitting: Family Medicine

## 2017-11-08 ENCOUNTER — Ambulatory Visit: Payer: Self-pay | Admitting: Podiatry

## 2018-01-03 ENCOUNTER — Telehealth: Payer: Self-pay | Admitting: General Practice

## 2018-01-03 NOTE — Telephone Encounter (Signed)
Called pt and apologized for the long wait time in returning call. Did not receive message until 4:47pm. Pt was scheduled for tomorrow at 9:45 per PCP.   Copied from [place] [phone]. Topic: Appointment Scheduling - Scheduling Inquiry for Clinic >> Jan 03, 2018  1:09 PM Sheran Luz wrote: Patient called inquiring if she could be seen today as she is experiencing "extreme itchiness" around her left nipple (pt denies any other symptoms)-no availability other than 4:15 slot with PCP. Patient is requesting a call back. Please advise.

## 2018-01-03 NOTE — Telephone Encounter (Signed)
I again don't understand the delay in getting messages from the [facility] to our office.  If this pt called at 1pm and wanted to be seen same day, that should have been a call to the office.  This should not have been a CRM that was routed at 4:47pm.  We need to somehow improve this process

## 2018-01-04 ENCOUNTER — Ambulatory Visit: Payer: Self-pay | Admitting: Family Medicine

## 2018-01-04 ENCOUNTER — Other Ambulatory Visit: Payer: Self-pay

## 2018-01-04 ENCOUNTER — Encounter: Payer: Self-pay | Admitting: Family Medicine

## 2018-01-04 VITALS — BP 108/72 | HR 76 | Temp 98.8°F | Resp 16 | Ht 62.0 in | Wt 116.0 lb

## 2018-01-04 DIAGNOSIS — T8543XA Leakage of breast prosthesis and implant, initial encounter: Secondary | ICD-10-CM

## 2018-01-04 DIAGNOSIS — Q839 Congenital malformation of breast, unspecified: Secondary | ICD-10-CM

## 2018-01-04 NOTE — Progress Notes (Signed)
   Subjective:    Patient ID: Kayla Gill, female    DOB: 06-Dec-1955, 62 y.o.   MRN: [id number]  HPI Breast itching- L nipple 'is very itchy'.  'comes and goes'.  Pt has hx of silicon injections.  Pt reports breasts are hardening from the injxns.  No nipple discharge, swelling, redness.  No skin changes.  sxs present x10-11 months intermittently   Review of Systems For ROS see HPI     Objective:   Physical Exam  Constitutional: She appears well-developed and well-nourished. No distress.  Pulmonary/Chest: Right breast exhibits no inverted nipple, no nipple discharge, no skin change and no tenderness. Left breast exhibits no inverted nipple, no nipple discharge, no skin change and no tenderness.  Pt has solid areas of both breasts due to previous direct silicone injections  Vitals reviewed.         Assessment & Plan:  Nipple itching- new.  No obvious skin changes or abnormality.  Pt feels itching is internal.  Due to previous silicone injections and inability for mammo to assess her breasts, MRI was recommended.  Will proceed with MRI as I am unclear if pt is having a reaction to the Silicone or there is another process at play.  In the meantime, pt to apply vasoline in case of external itching.  Pt expressed understanding and is in agreement w/ plan.

## 2018-01-04 NOTE — Patient Instructions (Signed)
Follow up as needed or as scheduled We'll call you with your breast MRI appt Apply Vasoline to the nipple to help w/ itching Call with any questions or concerns Happy Holidays!!!

## 2018-01-05 ENCOUNTER — Ambulatory Visit
Admission: RE | Admit: 2018-01-05 | Discharge: 2018-01-05 | Disposition: A | Discharge status: Home / Self Care | Payer: No Typology Code available for payment source | Source: Ambulatory Visit | Attending: Family Medicine | Admitting: Family Medicine

## 2018-01-05 DIAGNOSIS — T8543XA Leakage of breast prosthesis and implant, initial encounter: Secondary | ICD-10-CM

## 2018-01-05 DIAGNOSIS — Q839 Congenital malformation of breast, unspecified: Secondary | ICD-10-CM

## 2018-01-06 ENCOUNTER — Ambulatory Visit: Payer: No Typology Code available for payment source | Admitting: Family Medicine

## 2018-02-28 ENCOUNTER — Encounter: Payer: No Typology Code available for payment source | Admitting: Family Medicine

## 2018-08-29 ENCOUNTER — Other Ambulatory Visit: Payer: Self-pay

## 2018-08-29 ENCOUNTER — Encounter: Payer: Self-pay | Admitting: Family Medicine

## 2018-08-29 ENCOUNTER — Ambulatory Visit: Payer: Self-pay | Admitting: Family Medicine

## 2018-08-29 ENCOUNTER — Other Ambulatory Visit (HOSPITAL_COMMUNITY)
Admission: RE | Admit: 2018-08-29 | Discharge: 2018-08-29 | Disposition: A | Discharge status: Home / Self Care | Payer: Self-pay | Source: Ambulatory Visit | Attending: Family Medicine | Admitting: Family Medicine

## 2018-08-29 VITALS — BP 100/70 | HR 68 | Temp 98.0°F | Resp 16 | Ht 62.0 in | Wt 114.4 lb

## 2018-08-29 DIAGNOSIS — E785 Hyperlipidemia, unspecified: Secondary | ICD-10-CM

## 2018-08-29 DIAGNOSIS — Z124 Encounter for screening for malignant neoplasm of cervix: Secondary | ICD-10-CM

## 2018-08-29 DIAGNOSIS — Z0000 Encounter for general adult medical examination without abnormal findings: Secondary | ICD-10-CM

## 2018-08-29 LAB — HEPATIC FUNCTION PANEL
ALT: 19 U/L (ref 0–35)
AST: 26 U/L (ref 0–37)
Albumin: 4.6 g/dL (ref 3.5–5.2)
Alkaline Phosphatase: 62 U/L (ref 39–117)
Bilirubin, Direct: 0.1 mg/dL (ref 0.0–0.3)
Total Bilirubin: 0.5 mg/dL (ref 0.2–1.2)
Total Protein: 6.7 g/dL (ref 6.0–8.3)

## 2018-08-29 LAB — CBC WITH DIFFERENTIAL/PLATELET
Basophils Absolute: 0 10*3/uL (ref 0.0–0.1)
Basophils Relative: 0.4 % (ref 0.0–3.0)
Eosinophils Absolute: 0.1 10*3/uL (ref 0.0–0.7)
Eosinophils Relative: 1.9 % (ref 0.0–5.0)
HCT: 41.2 % (ref 36.0–46.0)
Hemoglobin: 13.9 g/dL (ref 12.0–15.0)
Lymphocytes Relative: 31.3 % (ref 12.0–46.0)
Lymphs Abs: 1.3 10*3/uL (ref 0.7–4.0)
MCHC: 33.8 g/dL (ref 30.0–36.0)
MCV: 96.4 fl (ref 78.0–100.0)
Monocytes Absolute: 0.2 10*3/uL (ref 0.1–1.0)
Monocytes Relative: 5.6 % (ref 3.0–12.0)
Neutro Abs: 2.5 10*3/uL (ref 1.4–7.7)
Neutrophils Relative %: 60.8 % (ref 43.0–77.0)
Platelets: 230 10*3/uL (ref 150.0–400.0)
RBC: 4.27 Mil/uL (ref 3.87–5.11)
RDW: 12.4 % (ref 11.5–15.5)
WBC: 4 10*3/uL (ref 4.0–10.5)

## 2018-08-29 LAB — BASIC METABOLIC PANEL
BUN: 16 mg/dL (ref 6–23)
CO2: 25 mEq/L (ref 19–32)
Calcium: 9.6 mg/dL (ref 8.4–10.5)
Chloride: 103 mEq/L (ref 96–112)
Creatinine, Ser: 0.83 mg/dL (ref 0.40–1.20)
GFR: 69.31 mL/min (ref >60.00)
Glucose, Bld: 91 mg/dL (ref 70–99)
Potassium: 3.9 mEq/L (ref 3.5–5.1)
Sodium: 138 mEq/L (ref 135–145)

## 2018-08-29 LAB — LIPID PANEL
Cholesterol: 159 mg/dL (ref 0–200)
HDL: 62.6 mg/dL (ref >39.00)
LDL Cholesterol: 83 mg/dL (ref 0–99)
NonHDL: 96.37
Total CHOL/HDL Ratio: 3
Triglycerides: 66 mg/dL (ref 0.0–149.0)
VLDL: 13.2 mg/dL (ref 0.0–40.0)

## 2018-08-29 LAB — TSH: TSH: 3.03 u[IU]/mL (ref 0.35–4.50)

## 2018-08-29 NOTE — Assessment & Plan Note (Signed)
Pt's PE WNL.  Pap done today.  UTD on mammo.  UTD on immunizations.  Check labs.  Anticipatory guidance provided.

## 2018-08-29 NOTE — Progress Notes (Signed)
   Subjective:    Patient ID: Kayla Gill, female    DOB: Dec 21, 1955, 63 y.o.   MRN: [id number]  HPI CPE- due for pap.  UTD on mammo, colonoscopy, immunizations.   Review of Systems Patient reports no vision/ hearing changes, adenopathy,fever, weight change,  persistant/recurrent hoarseness , swallowing issues, chest pain, palpitations, edema, persistant/recurrent cough, hemoptysis, dyspnea (rest/exertional/paroxysmal nocturnal), gastrointestinal bleeding (melena, rectal bleeding), abdominal pain, significant heartburn, bowel changes, GU symptoms (dysuria, hematuria, incontinence), Gyn symptoms (abnormal  bleeding, pain),  syncope, focal weakness, memory loss, numbness & tingling, skin/hair/nail changes, abnormal bruising or bleeding, anxiety, or depression.     Objective:   Physical Exam  General Appearance:    Alert, cooperative, no distress, appears stated age  Head:    Normocephalic, without obvious abnormality, atraumatic  Eyes:    PERRL, conjunctiva/corneas clear, EOM's intact, fundi    benign, both eyes  Ears:    Normal TM's and external ear canals, both ears  Nose:   Deferred due to COVID  Throat:   Neck:   Supple, symmetrical, trachea midline, no adenopathy;    Thyroid: no enlargement/tenderness/nodules  Back:     Symmetric, no curvature, ROM normal, no CVA tenderness  Lungs:     Clear to auscultation bilaterally, respirations unlabored  Chest Wall:    No tenderness or deformity   Heart:    Regular rate and rhythm, S1 and S2 normal, no murmur, rub   or gallop  Breast Exam:    Implants present bilaterally  Abdomen:     Soft, non-tender, bowel sounds active all four quadrants,    no masses, no organomegaly  Genitalia:    External genitalia normal, cervix normal in appearance, no CMT, uterus in normal size and position, adnexa w/out mass or tenderness, mucosa pink and moist, no lesions or discharge present  Rectal:    Normal external appearance  Extremities:   Extremities  normal, atraumatic, no cyanosis or edema  Pulses:   2+ and symmetric all extremities  Skin:   Skin color, texture, turgor normal, no rashes or lesions  Lymph nodes:   Cervical, supraclavicular, and axillary nodes normal  Neurologic:   CNII-XII intact, normal strength, sensation and reflexes    throughout          Assessment & Plan:

## 2018-08-29 NOTE — Assessment & Plan Note (Signed)
Pt has hx of mildly elevated lipids that she has been attempting to control w/ diet and exercise.  Check labs and determine if meds are needed.

## 2018-08-29 NOTE — Patient Instructions (Signed)
Follow up in 1 year or as needed We'll notify you of your lab results and make any changes if needed Keep up the good work on healthy diet and regular exercise- you look great! Call with any questions or concerns Stay Safe!

## 2018-08-30 LAB — CYTOLOGY - PAP
Diagnosis: NEGATIVE
HPV: NOT DETECTED

## 2018-10-27 ENCOUNTER — Telehealth: Payer: Self-pay | Admitting: Family Medicine

## 2018-10-27 NOTE — Telephone Encounter (Signed)
Pt called in asking for a glucose test, she states that she wants to be checked for diabetes. Pt can be reached at the home #

## 2018-10-27 NOTE — Telephone Encounter (Signed)
Please advise? Ok for just a lab test or an office visit?

## 2018-10-28 NOTE — Telephone Encounter (Signed)
She had labs done on July 27th and glucose was completely normal.  This was an appropriate screening for diabetes.  No additional work up needed

## 2018-10-28 NOTE — Telephone Encounter (Signed)
Patient notified of PCP recommendations and is agreement and expresses an understanding.   Ok for PEC to Discuss results / PCP recommendations / Schedule patient.   

## 2020-03-03 IMAGING — MR MR BILATERAL BREAST WITHOUT CONTRAST
5 of 6 series · 25 of 48 positions shown · non-contrast
Comparison: Screening mammogram February 19, 2017

CLINICAL DATA: 62-year-old patient with history of bilateral
silicone breast implants and bilateral silicone injections into the
breast tissue. The patient has had symptoms recently of left breast
itchiness and nipple itchiness. She had a bilateral screening
mammogram in February 2017.

EXAM:
BILATERAL BREAST MRI  WITHOUT CONTRAST
TECHNIQUE: Multiplanar, multisequence MR images of both breasts without
contrast. The exam was used to evaluate implant integrity. Because
of the sequences used and the lack of intravenous contrast, this
study is not diagnostic for breast lesions.

[Series 2: STIR · axial · 4.0mm · 0.89mm/px · z∈[-84,+104]mm · 4 of 47 slices shown (1 of 2)]
[im 1/47]
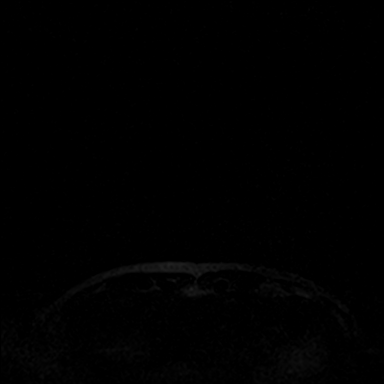
[im 16/47]
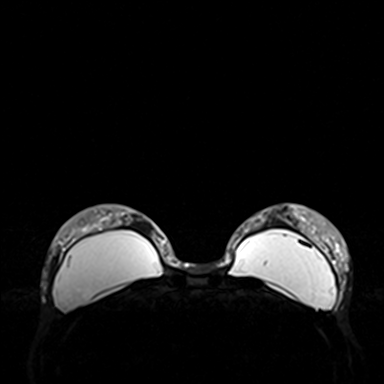
[im 31/47]
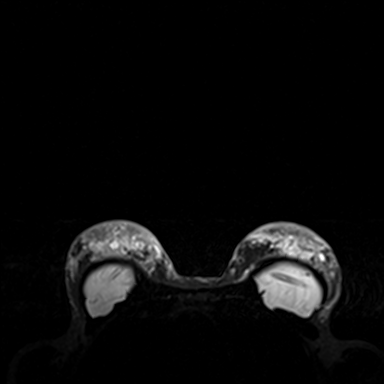
[im 47/47]
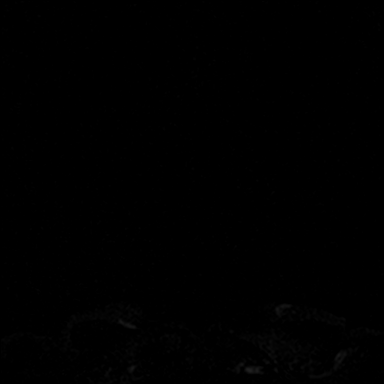

[Series 4: fl3d axial no · axial · 1.2mm · 0.89mm/px · z∈[-74,+94]mm · 11 of 160 slices shown]
[im 10/160]
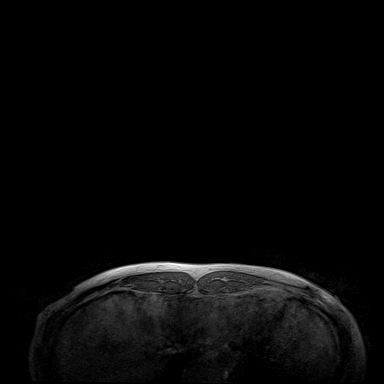
[im 20/160]
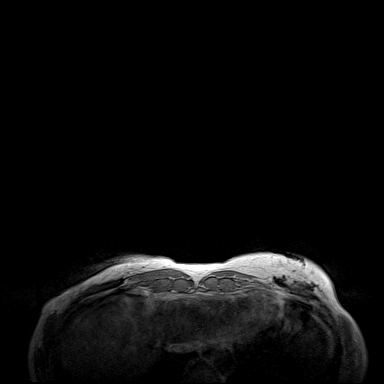
[im 30/160]
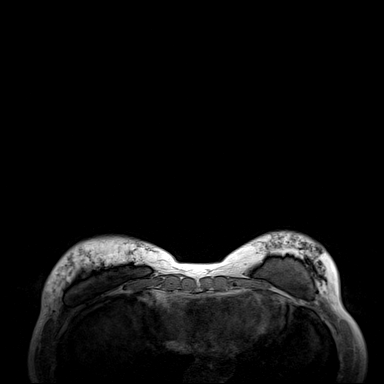
[im 50/160]
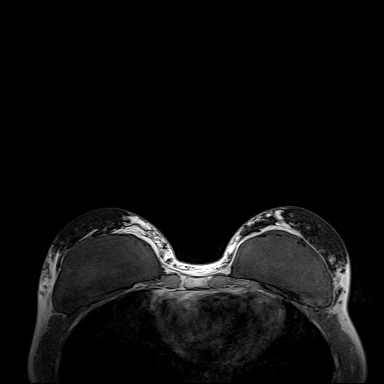
[im 70/160]
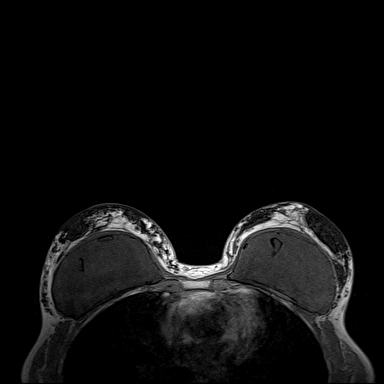
[im 80/160]
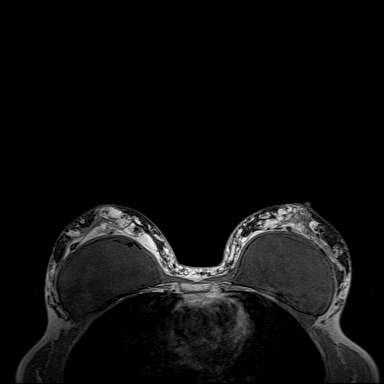
[im 90/160]
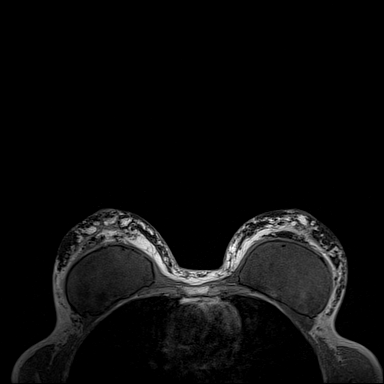
[im 110/160]
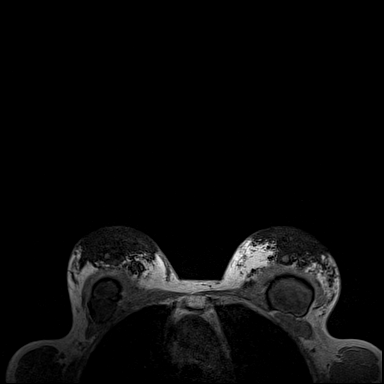
[im 130/160]
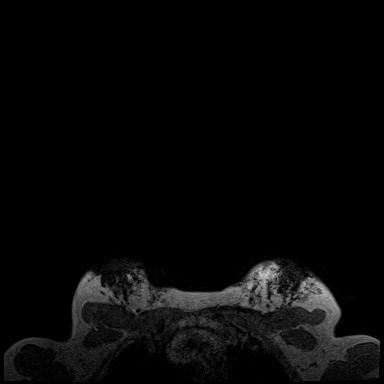
[im 140/160]
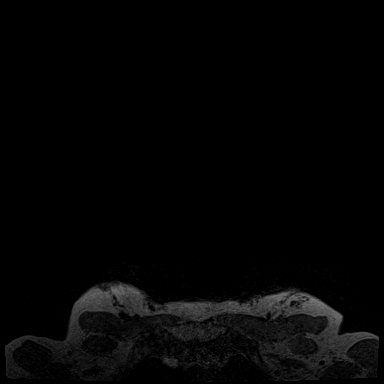
[im 150/160]
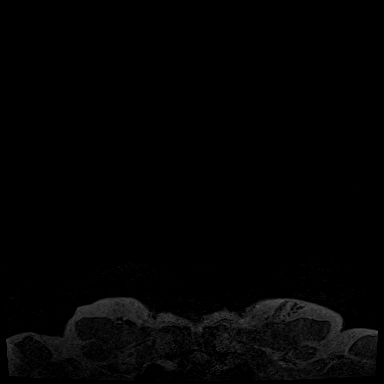

[Series 5: STIR fat-sat · axial · 4.0mm · 0.66mm/px · z∈[-84,+104]mm · 5 of 48 slices shown]
[im 1/48]
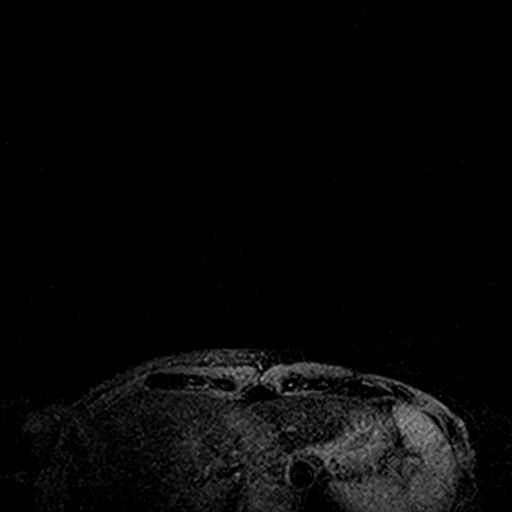
[im 12/48]
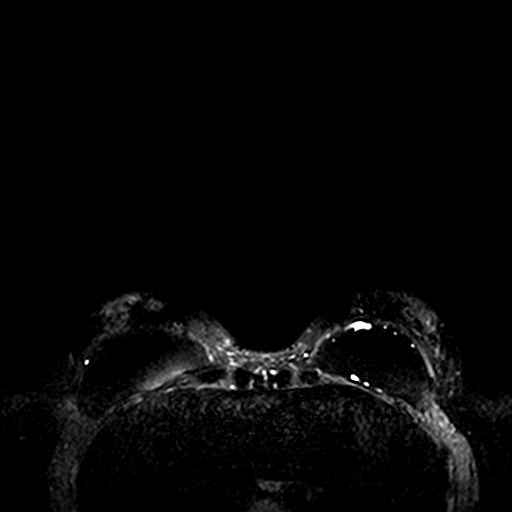
[im 24/48]
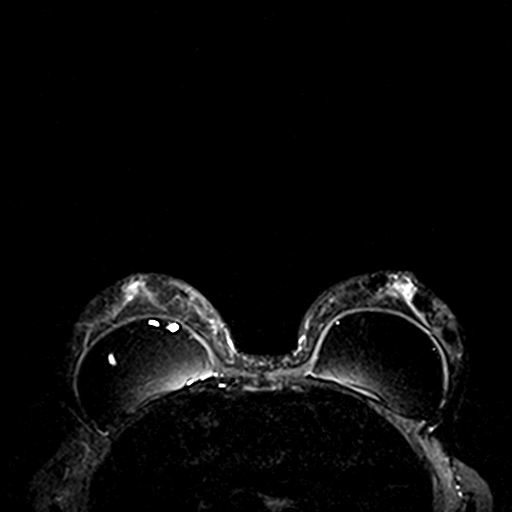
[im 36/48]
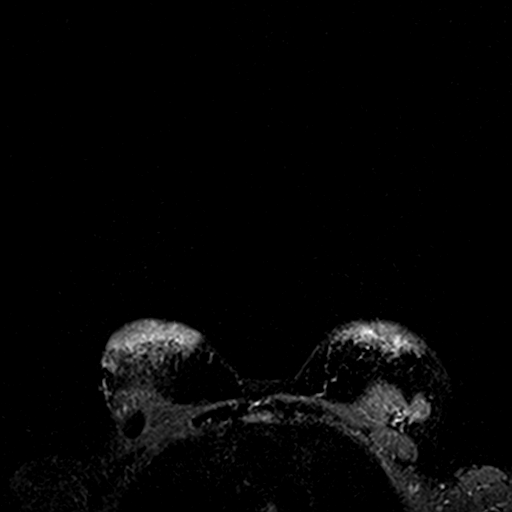
[im 48/48]
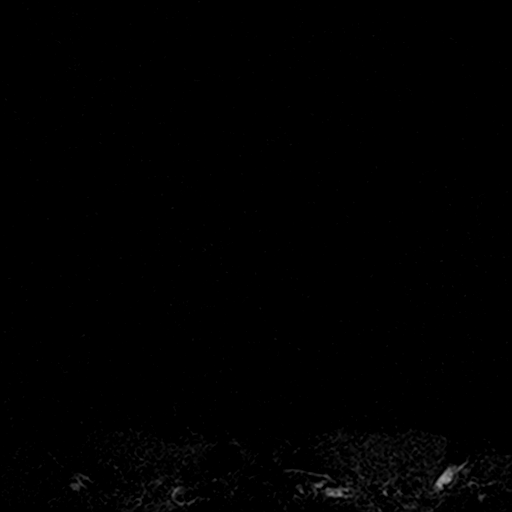

[Series 6: STIR · sagittal · 4.0mm · 0.62mm/px · 4 of 36 slices shown (2 of 2)]
[im 1/36]
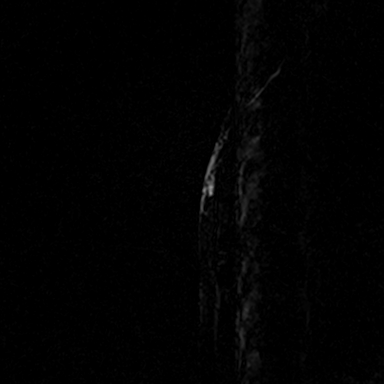
[im 12/36]
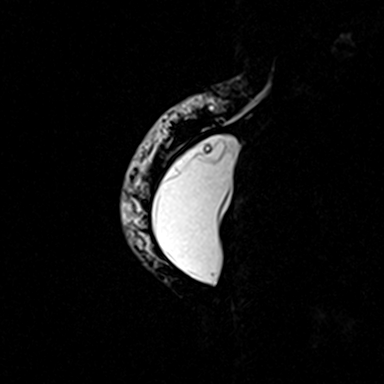
[im 24/36]
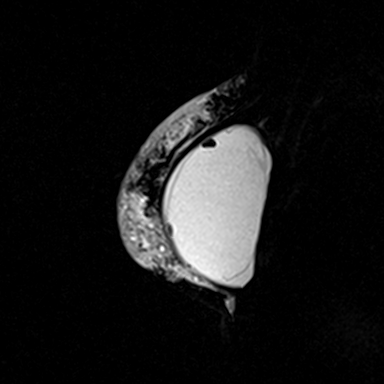
[im 36/36]
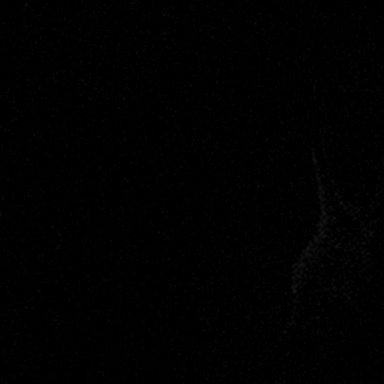

[Series 7: T2 fat-sat · sagittal · 3.0mm · 0.38mm/px · 1 of 45 slices shown]
[im 1/45]
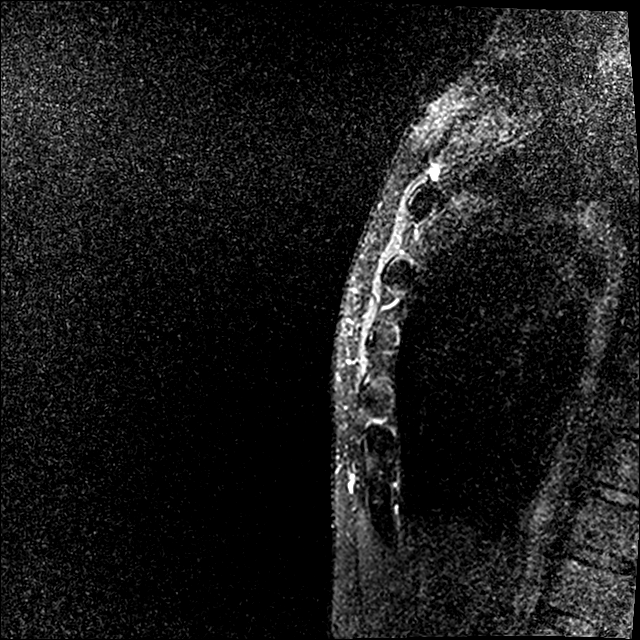

[25 of 48 positions shown; findings below may reference images not displayed]

Three-dimensional MR images were rendered by post-processing of the
original MR data on an independent workstation. The
three-dimensional MR images were interpreted, and findings are
reported in the following complete MRI report for this study. Three
dimensional images were evaluated at the independent [employer]
workstation
FINDINGS: Breast composition: d. Extreme fibroglandular tissue.

Right breast: Axial images only were performed of the right side,
given that it was the unaffected side. On water saturation
sequences, there is diffuse bright signal throughout the of breast
parenchyma corresponding well with the multiple silicone granulomas
seen throughout the right breast tissue on prior mammogram.

There is a retropectoral silicone implant. No extracapsular silicone
is identified. Findings suspicious for intracapsular rupture of the
silicone implant; there is a subcapsular line sign consistent with
minimally/mild intracapsular rupture of the silicone implant.

Left breast: There is a subpectoral silicone implant. There is a
subcapsular line sign, consistent with mild/minimal intracapsular
rupture of the silicone implant. No extracapsular silicone is
identified immediately adjacent to the silicone implant.

There are innumerable bright areas on the water saturation sequences
throughout the breast parenchyma consistent with silicone granulomas
from prior silicone injections. This finding of multiple silicone
granulomas throughout the breast parenchyma corresponds well with
the appearance of multiple silicone breast injections on prior
mammogram. There is no skin thickening.

No axillary lymphadenopathy is detected.
IMPRESSION: 1. Silicone throughout the breast parenchyma bilaterally consistent
with history of innumerable silicone granulomas from prior silicone
injections.

2. Bilateral subpectoral silicone breast implants. The subcapsular
line sign is present bilaterally, consistent with mild intracapsular
rupture of both silicone implants.

3. This MRI was performed without contrast for evaluation of
implant(s), and therefore is not tailored to evaluate for or exclude
malignancy.

RECOMMENDATION:
Bilateral mammography is due in February 2018, or sooner as needed.

## 2020-07-31 ENCOUNTER — Encounter: Payer: Self-pay | Admitting: *Deleted
